# Patient Record
Sex: Female | Born: 1955 | Race: Black or African American | Hispanic: No | Marital: Married | Smoking: Never smoker
Health system: Southern US, Community
[De-identification: ages and names within clinical notes are randomized; demographics above are authoritative.]

## PROBLEM LIST (undated history)

## (undated) DIAGNOSIS — L80 Vitiligo: Secondary | ICD-10-CM

## (undated) DIAGNOSIS — R42 Dizziness and giddiness: Secondary | ICD-10-CM

## (undated) DIAGNOSIS — L910 Hypertrophic scar: Secondary | ICD-10-CM

## (undated) HISTORY — DX: Vitiligo: L80

## (undated) HISTORY — DX: Hypertrophic scar: L91.0

## (undated) HISTORY — PX: ROTATOR CUFF REPAIR: SHX139

## (undated) HISTORY — DX: Dizziness and giddiness: R42

---

## 1999-03-07 ENCOUNTER — Encounter: Payer: Self-pay | Admitting: Emergency Medicine

## 1999-03-07 ENCOUNTER — Emergency Department (HOSPITAL_COMMUNITY): Admission: EM | Admit: 1999-03-07 | Discharge: 1999-03-07 | Payer: Self-pay | Admitting: Emergency Medicine

## 2010-10-19 ENCOUNTER — Emergency Department (HOSPITAL_COMMUNITY): Admission: EM | Admit: 2010-10-19 | Discharge: 2010-10-19 | Payer: Self-pay | Admitting: Emergency Medicine

## 2010-12-13 DIAGNOSIS — L80 Vitiligo: Secondary | ICD-10-CM

## 2010-12-13 HISTORY — DX: Vitiligo: L80

## 2012-01-31 ENCOUNTER — Encounter: Payer: Self-pay | Admitting: *Deleted

## 2012-02-01 ENCOUNTER — Encounter: Payer: Self-pay | Admitting: Family Medicine

## 2012-02-01 ENCOUNTER — Ambulatory Visit (INDEPENDENT_AMBULATORY_CARE_PROVIDER_SITE_OTHER): Payer: BC Managed Care – PPO | Admitting: Family Medicine

## 2012-02-01 VITALS — BP 114/67 | HR 74 | Temp 98.8°F | Resp 20 | Ht 65.0 in | Wt 171.8 lb

## 2012-02-01 DIAGNOSIS — R51 Headache: Secondary | ICD-10-CM

## 2012-02-01 DIAGNOSIS — R42 Dizziness and giddiness: Secondary | ICD-10-CM

## 2012-02-01 DIAGNOSIS — Z13220 Encounter for screening for lipoid disorders: Secondary | ICD-10-CM

## 2012-02-01 NOTE — Progress Notes (Signed)
  Subjective:    Patient ID: Leah Barrera, female    DOB: 1955-12-18, 56 y.o.   MRN: [id number]  HPI  This 56 y.o. AA female c/o vague headache with dizziness for several weeks and describes  a sensation which she says is a brief "sizzling" on the posterior right scalp. No other associated symptoms  Last eye exam was 1 year ago. Does have occasional spots flashing before eyes. Notices vertigo-type sensation  if she turns her head too fast. She denies any head or neck trauma. The headache does not interfere  with sleep, appetite or other activities. She does note that she still has some symptoms of perimenopause.    Review of Systems  Constitutional: Negative.   Eyes: Positive for visual disturbance. Negative for photophobia.  Respiratory: Negative.   Cardiovascular: Negative.   Gastrointestinal: Negative.   Genitourinary: Negative.   Musculoskeletal: Negative.   Neurological: Positive for dizziness and headaches. Negative for tremors, seizures, speech difficulty and numbness.  Hematological: Negative.   Psychiatric/Behavioral: Negative.        Objective:   Physical Exam  Nursing note and vitals reviewed. Constitutional: She is oriented to person, place, and time. She appears well-developed and well-nourished. No distress.  HENT:  Head: Normocephalic and atraumatic.  Right Ear: External ear normal.  Left Ear: External ear normal.  Nose: Nose normal.  Mouth/Throat: Oropharynx is clear and moist.  Eyes: Conjunctivae and EOM are normal. Pupils are equal, round, and reactive to light. No scleral icterus.  Neck: Normal range of motion. Neck supple. No thyromegaly present.  Cardiovascular: Normal rate, regular rhythm and normal heart sounds.   No murmur heard. Pulmonary/Chest: Effort normal and breath sounds normal. No respiratory distress. She has no wheezes.  Musculoskeletal: Normal range of motion. She exhibits no edema.  Lymphadenopathy:    She has no cervical adenopathy.    Neurological: She is alert and oriented to person, place, and time. She has normal reflexes. No cranial nerve deficit. Coordination normal.  Skin: Skin is warm and dry.       Facial area: pigmentation variable with large hypopigmented areas  Psychiatric: She has a normal mood and affect. Her behavior is normal.          Assessment & Plan:   1. Headache disorder  CBC, Comprehensive metabolic panel, TSH, Vitamin D, 25-hydroxy  2. Lipid screening  Lipid panel   Labs to be drawn in the AM; phlebotomist was unable to obtain specimens so pt will hydrate tonight and RTC in AM.

## 2012-02-01 NOTE — Patient Instructions (Signed)
Headache, General, Unknown Cause The specific cause of your headache may not have been found today. There are many causes and types of headache. A few common ones are:  Tension headache.   Migraine.   Infections (examples: dental and sinus infections).   Bone and/or joint problems in the neck or jaw.   Depression.   Eye problems.  These headaches are not life threatening.  Headaches can sometimes be diagnosed by a patient history and a physical exam. Sometimes, lab and imaging studies (such as x-ray and/or CT scan) are used to rule out more serious problems. In some cases, a spinal tap (lumbar puncture) may be requested. There are many times when your exam and tests may be normal on the first visit even when there is a serious problem causing your headaches. Because of that, it is very important to follow up with your doctor or local clinic for further evaluation. FINDING OUT THE RESULTS OF TESTS  If a radiology test was performed, a radiologist will review your results.   You will be contacted by the emergency department or your physician if any test results require a change in your treatment plan.   Not all test results may be available during your visit. If your test results are not back during the visit, make an appointment with your caregiver to find out the results. Do not assume everything is normal if you have not heard from your caregiver or the medical facility. It is important for you to follow up on all of your test results.  HOME CARE INSTRUCTIONS   Keep follow-up appointments with your caregiver, or any specialist referral.   Only take over-the-counter or prescription medicines for pain, discomfort, or fever as directed by your caregiver.   Biofeedback, massage, or other relaxation techniques may be helpful.   Ice packs or heat applied to the head and neck can be used. Do this three to four times per day, or as needed.   Call your doctor if you have any questions or  concerns.   If you smoke, you should quit.  SEEK MEDICAL CARE IF:   You develop problems with medications prescribed.   You do not respond to or obtain relief from medications.   You have a change from the usual headache.   You develop nausea or vomiting.  SEEK IMMEDIATE MEDICAL CARE IF:   If your headache becomes severe.   You have an unexplained oral temperature above 102 F (38.9 C), or as your caregiver suggests.   You have a stiff neck.   You have loss of vision.   You have muscular weakness.   You have loss of muscular control.   You develop severe symptoms different from your first symptoms.   You start losing your balance or have trouble walking.   You feel faint or pass out.  MAKE SURE YOU:   Understand these instructions.   Will watch your condition.   Will get help right away if you are not doing well or get worse.  Document Released: 11/29/2005 Document Revised: 08/11/2011 Document Reviewed: 07/18/2008 [facility] Patient Information 2012 [place], [place]    .Vertigo Vertigo means you feel like you or your surroundings are moving when they are not. Vertigo can be dangerous if it occurs when you are at work, driving, or performing difficult activities.  CAUSES  Vertigo occurs when there is a conflict of signals sent to your brain from the visual and sensory systems in your body. There are many different  causes of vertigo, including:  Infections, especially in the inner ear.   A bad reaction to a drug or misuse of alcohol and medicines.   Withdrawal from drugs or alcohol.   Rapidly changing positions, such as lying down or rolling over in bed.   A migraine headache.   Decreased blood flow to the brain.   Increased pressure in the brain from a head injury, infection, tumor, or bleeding.  SYMPTOMS  You may feel as though the world is spinning around or you are falling to the ground. Because your balance is upset, vertigo can cause nausea and  vomiting. You may have involuntary eye movements (nystagmus). DIAGNOSIS  Vertigo is usually diagnosed by physical exam. If the cause of your vertigo is unknown, your caregiver may perform imaging tests, such as an MRI scan (magnetic resonance imaging). TREATMENT  Most cases of vertigo resolve on their own, without treatment. Depending on the cause, your caregiver may prescribe certain medicines. If your vertigo is related to body position issues, your caregiver may recommend movements or procedures to correct the problem. In rare cases, if your vertigo is caused by certain inner ear problems, you may need surgery. HOME CARE INSTRUCTIONS   Follow your caregiver's instructions.   Avoid driving.   Avoid operating heavy machinery.   Avoid performing any tasks that would be dangerous to you or others during a vertigo episode.   Tell your caregiver if you notice that certain medicines seem to be causing your vertigo. Some of the medicines used to treat vertigo episodes can actually make them worse in some people.  SEEK IMMEDIATE MEDICAL CARE IF:   Your medicines do not relieve your vertigo or are making it worse.   You develop problems with talking, walking, weakness, or using your arms, hands, or legs.   You develop severe headaches.   Your nausea or vomiting continues or gets worse.   You develop visual changes.   A family member notices behavioral changes.   Your condition gets worse.  MAKE SURE YOU:  Understand these instructions.   Will watch your condition.   Will get help right away if you are not doing well or get worse.  Document Released: 09/08/2005 Document Revised: 08/11/2011 Document Reviewed: 06/17/2011 [facility] Patient Information 2012 [place], [place].

## 2012-02-02 ENCOUNTER — Other Ambulatory Visit: Payer: Self-pay | Admitting: *Deleted

## 2012-02-02 ENCOUNTER — Other Ambulatory Visit: Payer: Self-pay | Admitting: Family Medicine

## 2012-02-02 ENCOUNTER — Other Ambulatory Visit (INDEPENDENT_AMBULATORY_CARE_PROVIDER_SITE_OTHER): Payer: BC Managed Care – PPO

## 2012-02-02 DIAGNOSIS — Z13220 Encounter for screening for lipoid disorders: Secondary | ICD-10-CM

## 2012-02-02 DIAGNOSIS — R51 Headache: Secondary | ICD-10-CM

## 2012-02-03 LAB — LIPID PANEL
Cholesterol: 191 mg/dL (ref 0–200)
Total CHOL/HDL Ratio: 3.5 Ratio
Triglycerides: 70 mg/dL (ref <150)
VLDL: 14 mg/dL (ref 0–40)

## 2012-02-03 LAB — CBC
HCT: 41.3 % (ref 36.0–46.0)
Hemoglobin: 13.7 g/dL (ref 12.0–15.0)
MCH: 28 pg (ref 26.0–34.0)
MCV: 84.3 fL (ref 78.0–100.0)
RBC: 4.9 MIL/uL (ref 3.87–5.11)

## 2012-02-03 LAB — VITAMIN D 25 HYDROXY (VIT D DEFICIENCY, FRACTURES): Vit D, 25-Hydroxy: 21 ng/mL — ABNORMAL LOW (ref 30–89)

## 2012-02-03 LAB — COMPREHENSIVE METABOLIC PANEL
CO2: 22 mEq/L (ref 19–32)
Creat: 0.76 mg/dL (ref 0.50–1.10)
Glucose, Bld: 76 mg/dL (ref 70–99)
Total Bilirubin: 0.7 mg/dL (ref 0.3–1.2)
Total Protein: 7.1 g/dL (ref 6.0–8.3)

## 2012-02-04 NOTE — Progress Notes (Signed)
Quick Note:  Please call pt and advise that the following labs are abnormal... Vitamin D level is low. She needs to get an OTC supplement- 2000 IU per capsule or tablet and take 1 every day  LDL ("bad") cholesterol is a little high but improving diet and healthier eating and exercise will help with that. (the below normal WBC is OK)  Please provide a copy of labs to pt.   Thank you.   ______

## 2012-12-13 DIAGNOSIS — R42 Dizziness and giddiness: Secondary | ICD-10-CM

## 2012-12-13 HISTORY — DX: Dizziness and giddiness: R42

## 2013-02-01 ENCOUNTER — Ambulatory Visit (INDEPENDENT_AMBULATORY_CARE_PROVIDER_SITE_OTHER): Payer: BC Managed Care – PPO | Admitting: Family Medicine

## 2013-02-01 VITALS — BP 132/76 | HR 83 | Temp 97.9°F | Resp 16 | Ht 65.0 in | Wt 174.8 lb

## 2013-02-01 DIAGNOSIS — R42 Dizziness and giddiness: Secondary | ICD-10-CM

## 2013-02-01 LAB — COMPREHENSIVE METABOLIC PANEL
Alkaline Phosphatase: 57 U/L (ref 39–117)
BUN: 13 mg/dL (ref 6–23)
Glucose, Bld: 91 mg/dL (ref 70–99)
Total Bilirubin: 0.6 mg/dL (ref 0.3–1.2)

## 2013-02-01 LAB — POCT CBC
Granulocyte percent: 68 %G (ref 37–80)
MCH, POC: 27.9 pg (ref 27–31.2)
MCV: 86.7 fL (ref 80–97)
MID (cbc): 0.4 (ref 0–0.9)
POC LYMPH PERCENT: 25.4 %L (ref 10–50)
POC MID %: 6.6 %M (ref 0–12)
Platelet Count, POC: 229 10*3/uL (ref 142–424)
RDW, POC: 15.4 %
WBC: 5.5 10*3/uL (ref 4.6–10.2)

## 2013-02-01 MED ORDER — MECLIZINE HCL 25 MG PO TABS: 25.0000 mg | ORAL_TABLET | Freq: Three times a day (TID) | ORAL | Status: DC | PRN

## 2013-02-01 NOTE — Progress Notes (Signed)
[facility] [address], [place] [address] [phone]  Date:  02/01/2013   Name:  Leah Barrera   DOB:  [birth year]   MRN:  [id number]  PCP:  No primary provider on file.    Chief Complaint: Dizziness and Nausea   History of Present Illness:  Leah Barrera is a 57 y.o. very pleasant female patient who presents with the following:  Last seen at our office about one year ago for headache and dizziness, and what sounds like vertigo.   She is here with dizziness and nausea today which has been present for the last 5 or 6 days.   She had a similar problem over the summer.  She did have nausea and vomiting over the weekend- but none for the last 4 days.  Her symptoms are getting better   She has been using dramamine which helps some.  She is no longer vomiting.  She did have a little diarrhea as well  She notes that her spinning sensation can last "all day."  Changing position would make it worse.  Worse with change in head position. No tinnitus, no change in her hearing.   No trauma, no falls.  She is not currently having a HA  She does not have a regular doctor.   No other symptoms of illness such as ST, fever.  No abdominal pain.   She states she has had symtoms like this 4 or 5 times over her life She has still been eating.  She did not eat yet this morinng.  She was not able to eat over the weekend but she is now able to again  Patient Active Problem List  Diagnosis  . Headache disorder    Past Medical History  Diagnosis Date  . Keloid scar     Past Surgical History  Procedure Laterality Date  . Rotator cuff repair      Left    History  Substance Use Topics  . Smoking status: Never Smoker   . Smokeless tobacco: Not on file  . Alcohol Use: No    Family History  Problem Relation Age of Onset  . Heart disease Mother   . Stroke Father     Allergies  Allergen Reactions  . Penicillins Swelling and Rash    Medication list has  been reviewed and updated.  No current outpatient prescriptions on file prior to visit.   No current facility-administered medications on file prior to visit.    Review of Systems:  As per HPI- otherwise negative.   Physical Examination: Filed Vitals:   02/01/13 0859  BP: 132/76  Pulse: 83  Temp: 97.9 F (36.6 C)  Resp: 16   Filed Vitals:   02/01/13 0859  Height: 5\' 5"  (1.651 m)  Weight: 174 lb 12.8 oz (79.289 kg)   Body mass index is 29.09 kg/(m^2). Ideal Body Weight: Weight in (lb) to have BMI = 25: 149.9  GEN: WDWN, NAD, Non-toxic, A & O x 3, overweight, looks well and well groomed/ wearing make up HEENT: Atraumatic, Normocephalic. Neck supple. No masses, No LAD.  Bilateral TM wnl, oropharynx normal.  PEERL,EOMI.   Ears and Nose: No external deformity. CV: RRR, No M/G/R. No JVD. No thrill. No extra heart sounds. PULM: CTA B, no wheezes, crackles, rhonchi. No retractions. No resp. distress. No accessory muscle use. ABD: S, NT, ND. No rebound. No HSM. EXTR: No c/c/e NEURO Normal gait.  Normal strength, sensation and DTR all  extremities.  Positive Dix- halpike on the right, borderline positive on the left PSYCH: Normally interactive. Conversant. Not depressed or anxious appearing.  Calm demeanor.   Results for orders placed in visit on 02/01/13  POCT CBC      Result Value Range   WBC 5.5  4.6 - 10.2 K/uL   Lymph, poc 1.4  0.6 - 3.4   POC LYMPH PERCENT 25.4  10 - 50 %L   MID (cbc) 0.4  0 - 0.9   POC MID % 6.6  0 - 12 %M   POC Granulocyte 3.7  2 - 6.9   Granulocyte percent 68.0  37 - 80 %G   RBC 4.94  4.04 - 5.48 M/uL   Hemoglobin 13.8  12.2 - 16.2 g/dL   HCT, POC 16.1  09.6 - 47.9 %   MCV 86.7  80 - 97 fL   MCH, POC 27.9  27 - 31.2 pg   MCHC 32.2  31.8 - 35.4 g/dL   RDW, POC 04.5     Platelet Count, POC 229  142 - 424 K/uL   MPV 10.1  0 - 99.8 fL  GLUCOSE, POCT (MANUAL RESULT ENTRY)      Result Value Range   POC Glucose 78  70 - 99 mg/dl    Assessment and  Plan: Dizziness and giddiness - Plan: POCT CBC, POCT glucose (manual entry), Comprehensive metabolic panel, TSH, meclizine (ANTIVERT) 25 MG tablet  Leah Barrera is here today with recurrent vertigo.  Her symptoms are getting better, but she would like to try a different medication so will try meclizine. Last took dramamine this am- advised her to allow at least 8 hours prior to taking the meclizine.  Discussed the often benign course of vertigo, but also possibility of other dangerous etiologies. At this time her neuro exam is normal and she looks well.  Did offer to arrange an MRI other imaging today.  However, she would prefer to give her symptoms a little bit more time to resolve and will follow- up if not better.  Also will follow- up with her when I receive the rest of her labs  Jan Olano, MD

## 2013-02-01 NOTE — Patient Instructions (Addendum)
Use the meclizine as needed for dizziness- let me know if you are not continuing to get better over the next few days- Sooner if worse.   I will be in touch with the rest of your labs as soon as they come in

## 2013-02-02 ENCOUNTER — Encounter: Payer: Self-pay | Admitting: Family Medicine

## 2016-10-11 ENCOUNTER — Ambulatory Visit (INDEPENDENT_AMBULATORY_CARE_PROVIDER_SITE_OTHER): Payer: BC Managed Care – PPO | Admitting: Physician Assistant

## 2016-10-11 VITALS — BP 110/76 | HR 69 | Temp 98.2°F | Resp 16 | Ht 65.0 in | Wt 170.0 lb

## 2016-10-11 DIAGNOSIS — L03012 Cellulitis of left finger: Secondary | ICD-10-CM | POA: Diagnosis not present

## 2016-10-11 MED ORDER — DOXYCYCLINE HYCLATE 100 MG PO CAPS: 100.0000 mg | ORAL_CAPSULE | Freq: Two times a day (BID) | ORAL | 0 refills | Status: AC

## 2016-10-11 NOTE — Progress Notes (Signed)
   Subjective:    Patient ID: Leah Barrera, female    DOB: 06/30/1956, 60 y.o.   MRN: [id number]  HPI: Presents with swollen left index finger which she first noticed 5 days ago. States she had an ingrown fingernail on this finger early last week. Associated symptoms include warmth, throbbing, and swelling noted around the finger nail. Denies drainage from the site. States she tried using an ice pack and alcohol on the site at home without much relief. Denies fevers/chills or nausea/vomiting at home.   Declines flu vaccine at this time.  Review of Systems  Constitutional: Negative for chills and fever.  HENT: Negative for congestion, ear discharge, postnasal drip, rhinorrhea, sinus pressure and sore throat.   Eyes: Negative for pain, discharge and itching.  Respiratory: Negative for cough, chest tightness and shortness of breath.   Cardiovascular: Negative for chest pain and palpitations.  Gastrointestinal: Negative for blood in stool, constipation, diarrhea, nausea and vomiting.  Genitourinary: Negative for difficulty urinating, dysuria, frequency, hematuria and urgency.  Skin: Negative for rash.  Neurological: Positive for headaches. Negative for dizziness.  Psychiatric/Behavioral: Negative for dysphoric mood.   Allergies  Allergen Reactions  . Penicillins Swelling and Rash   No current home medications  Patient Active Problem List   Diagnosis Date Noted  . Headache disorder 02/01/2012        Objective:   Physical Exam  Constitutional: She is oriented to person, place, and time. She appears well-developed and well-nourished.  HENT:  Head: Normocephalic and atraumatic.  Eyes: Conjunctivae are normal. Pupils are equal, round, and reactive to light.  Musculoskeletal:       Right hand: She exhibits normal range of motion, no tenderness, normal capillary refill and no swelling. Normal sensation noted. Normal strength noted.       Left hand: She exhibits decreased range of  motion, tenderness and swelling. She exhibits no bony tenderness, normal capillary refill and no deformity. Normal sensation noted. Normal strength noted.       Hands: Neurological: She is alert and oriented to person, place, and time.  Psychiatric: She has a normal mood and affect. Her behavior is normal.   Procedure: Paronychia I&D Verbal consent obtained from patient. Cleaned site with alcohol. #11 blade used to incise and drain, purulent drainage from site. Wound culture of drainage obtained. Finger cleansed and dressed appropriately.     Assessment & Plan:  1. Paronychia of left index finger I&D in clinic with purulent drainage from site. Finger cleansed and dressed. Recommended warm compresses on site and change bandage every 48-72 hours. Rx Doxycycline 100 mg BID x 10 days with food. Instructed to RTC if swelling increases, systemic symptoms, or site is not improving with medication. Wound culture pending, will change as needed upon review of results.  - WOUND CULTURE - doxycycline (VIBRAMYCIN) 100 MG capsule; Take 1 capsule (100 mg total) by mouth 2 (two) times daily.  Dispense: 20 capsule; Refill: 0

## 2016-10-11 NOTE — Patient Instructions (Addendum)
     IF you received an x-ray today, you will receive an invoice from [facility]. Please contact [facility] at [phone] with questions or concerns regarding your invoice.   IF you received labwork today, you will receive an invoice from [facility]. Please contact [facility] at [phone] with questions or concerns regarding your invoice.   Our billing staff will not be able to assist you with questions regarding bills from these companies.  You will be contacted with the lab results as soon as they are available. The fastest way to get your results is to activate your My Chart account. Instructions are located on the last page of this paperwork. If you have not heard from us regarding the results in 2 weeks, please contact this office.     Fingertip Infection When an infection is around the nail, it is called a paronychia. When it appears over the tip of the finger, it is called a felon. These infections are due to minor injuries or cracks in the skin. If they are not treated properly, they can lead to bone infection and permanent damage to the fingernail. Incision and drainage is necessary if a pus pocket (an abscess) has formed. Antibiotics and pain medicine may also be needed. Keep your hand elevated for the next 2-3 days to reduce swelling and pain. If a pack was placed in the abscess, it should be removed in 1-2 days by your caregiver. Soak the finger in warm water for 20 minutes 4 times daily to help promote drainage. Keep the hands as dry as possible. Wear protective gloves with cotton liners. See your caregiver for follow-up care as recommended.  HOME CARE INSTRUCTIONS   Keep wound clean, dry and dressed as suggested by your caregiver.  Soak in warm salt water for fifteen minutes, four times per day for bacterial infections.  Your caregiver will prescribe an antibiotic if a bacterial infection is suspected. Take antibiotics as directed  and finish the prescription, even if the problem appears to be improving before the medicine is gone.  Only take over-the-counter or prescription medicines for pain, discomfort, or fever as directed by your caregiver. SEEK IMMEDIATE MEDICAL CARE IF:  There is redness, swelling, or increasing pain in the wound.  Pus or any other unusual drainage is coming from the wound.  An unexplained oral temperature above 102 F (38.9 C) develops.  You notice a foul smell coming from the wound or dressing. MAKE SURE YOU:   Understand these instructions.  Monitor your condition.  Contact your caregiver if you are getting worse or not improving.   This information is not intended to replace advice given to you by your health care provider. Make sure you discuss any questions you have with your health care provider.   Document Released: 01/06/2005 Document Revised: 02/21/2012 Document Reviewed: 05/19/2015 Elsevier Interactive Patient Education [employer]2016 Elsevier Inc.

## 2016-10-11 NOTE — Progress Notes (Signed)
   Patient ID: Leah Barrera, female     DOB: 02/24/1956, 60 y.o.    MRN: [id number]  PCP: No PCP Per Patient  Chief Complaint  Patient presents with  . Finger swollen    left index , x 2 days    Subjective:    HPI  Presents for evaluation of 5 days of pain, redness and swelling of the LEFT index fingers.  She reports having had a hangnail just prior to the onset of her symptoms.  Ice application and alcohol rub have been ineffective.   Prior to Admission medications   Not on File     Allergies  Allergen Reactions  . Penicillins Swelling and Rash     Patient Active Problem List   Diagnosis Date Noted  . Headache disorder 02/01/2012     Family History  Problem Relation Age of Onset  . Heart disease Mother   . Stroke Father      Social History   Social History  . Marital status: Married    Spouse name: N/A  . Number of children: N/A  . Years of education: 17+   Occupational History  . Geologist, engineeringteacher assistant    Social History Main Topics  . Smoking status: Never Smoker  . Smokeless tobacco: Never Used  . Alcohol use No  . Drug use: No  . Sexual activity: Not Currently   Other Topics Concern  . Not on file   Social History Narrative  . No narrative on file        Review of Systems Constitutional: Negative for chills and fever.  HENT: Negative for congestion, ear discharge, postnasal drip, rhinorrhea, sinus pressure and sore throat.   Eyes: Negative for pain, discharge and itching.  Respiratory: Negative for cough, chest tightness and shortness of breath.   Cardiovascular: Negative for chest pain and palpitations.  Gastrointestinal: Negative for blood in stool, constipation, diarrhea, nausea and vomiting.  Genitourinary: Negative for difficulty urinating, dysuria, frequency, hematuria and urgency.  Skin: Negative for rash.  Neurological: Positive for headaches. Negative for dizziness.  Psychiatric/Behavioral: Negative for dysphoric mood.        Objective:  Physical Exam  Constitutional: She is oriented to person, place, and time. She appears well-developed and well-nourished. She is active and cooperative. No distress.  BP 110/76 (BP Location: Left Arm, Patient Position: Sitting, Cuff Size: Normal)   Pulse 69   Temp 98.2 F (36.8 C) (Oral)   Resp 16   Ht 5\' 5"  (1.651 m)   Wt 170 lb (77.1 kg)   SpO2 99%   BMI 28.29 kg/m    Eyes: Conjunctivae are normal.  Pulmonary/Chest: Effort normal.  Musculoskeletal:       Hands: Neurological: She is alert and oriented to person, place, and time.  Skin: Skin is warm and dry.  Psychiatric: She has a normal mood and affect. Her speech is normal and behavior is normal.    PROCEDURE: Alcohol pad prep. 1 blade used to incise the abscess with drainage of greenish purulence. Cleansed. Bandaid applied.         Assessment & Plan:  1. Paronychia of left index finger Local wound care. Warm compresses. NSAIDS. Anticipatory guidance provided. RTC if symptoms worsen/persist. - WOUND CULTURE - doxycycline (VIBRAMYCIN) 100 MG capsule; Take 1 capsule (100 mg total) by mouth 2 (two) times daily.  Dispense: 20 capsule; Refill: 0   Fernande Brashelle S. Kamaron Deskins, PA-C Physician Assistant-Certified [facility]

## 2016-10-14 LAB — WOUND CULTURE: Gram Stain: NONE SEEN

## 2017-07-21 ENCOUNTER — Ambulatory Visit (INDEPENDENT_AMBULATORY_CARE_PROVIDER_SITE_OTHER): Payer: BC Managed Care – PPO

## 2017-07-21 ENCOUNTER — Ambulatory Visit (INDEPENDENT_AMBULATORY_CARE_PROVIDER_SITE_OTHER): Payer: BC Managed Care – PPO | Admitting: Physician Assistant

## 2017-07-21 ENCOUNTER — Encounter: Payer: Self-pay | Admitting: Physician Assistant

## 2017-07-21 VITALS — BP 129/69 | HR 64 | Temp 98.3°F | Resp 16 | Ht 65.0 in | Wt 174.0 lb

## 2017-07-21 DIAGNOSIS — H538 Other visual disturbances: Secondary | ICD-10-CM

## 2017-07-21 DIAGNOSIS — M25511 Pain in right shoulder: Secondary | ICD-10-CM | POA: Diagnosis not present

## 2017-07-21 DIAGNOSIS — R238 Other skin changes: Secondary | ICD-10-CM | POA: Diagnosis not present

## 2017-07-21 DIAGNOSIS — R233 Spontaneous ecchymoses: Secondary | ICD-10-CM

## 2017-07-21 LAB — GLUCOSE, POCT (MANUAL RESULT ENTRY): POC GLUCOSE: 85 mg/dL (ref 70–99)

## 2017-07-21 LAB — POCT CBC
GRANULOCYTE PERCENT: 50.9 % (ref 37–80)
HEMATOCRIT: 37.8 % (ref 37.7–47.9)
Hemoglobin: 12.8 g/dL (ref 12.2–16.2)
Lymph, poc: 1 (ref 0.6–3.4)
MCH: 26.8 pg — AB (ref 27–31.2)
MCHC: 33.8 g/dL (ref 31.8–35.4)
MCV: 79.2 fL — AB (ref 80–97)
MID (CBC): 0.4 (ref 0–0.9)
MPV: 7.9 fL (ref 0–99.8)
PLATELET COUNT, POC: 159 10*3/uL (ref 142–424)
POC GRANULOCYTE: 1.5 — AB (ref 2–6.9)
POC LYMPH PERCENT: 36 %L (ref 10–50)
POC MID %: 13.1 % — AB (ref 0–12)
RBC: 4.77 M/uL (ref 4.04–5.48)
RDW, POC: 15.6 %
WBC: 2.9 10*3/uL — AB (ref 4.6–10.2)

## 2017-07-21 LAB — CMP14+EGFR
A/G RATIO: 1 — AB (ref 1.2–2.2)
ALK PHOS: 57 IU/L (ref 39–117)
ALT: 15 IU/L (ref 0–32)
AST: 20 IU/L (ref 0–40)
Albumin: 3.8 g/dL (ref 3.6–4.8)
BILIRUBIN TOTAL: 0.5 mg/dL (ref 0.0–1.2)
BUN / CREAT RATIO: 19 (ref 12–28)
BUN: 14 mg/dL (ref 8–27)
CO2: 20 mmol/L (ref 20–29)
Calcium: 8.9 mg/dL (ref 8.7–10.3)
Chloride: 106 mmol/L (ref 96–106)
Creatinine, Ser: 0.74 mg/dL (ref 0.57–1.00)
GFR calc Af Amer: 100 mL/min/{1.73_m2} (ref >59)
GFR calc non Af Amer: 86 mL/min/{1.73_m2} (ref >59)
GLOBULIN, TOTAL: 3.7 g/dL (ref 1.5–4.5)
Glucose: 79 mg/dL (ref 65–99)
POTASSIUM: 4.2 mmol/L (ref 3.5–5.2)
SODIUM: 141 mmol/L (ref 134–144)
Total Protein: 7.5 g/dL (ref 6.0–8.5)

## 2017-07-21 NOTE — Progress Notes (Signed)
PRIMARY CARE AT [facility] [address], [place] [address] [phone]  Date:  07/21/2017   Name:  Leah Barrera   DOB:  September 07, 1956   MRN:  [id number]  PCP:  Patient, No Pcp Per    History of Present Illness:  Leah Barrera is a 61 y.o. female patient who presents to PCP with  Chief Complaint  Patient presents with  . Shoulder Pain    right/ x 3 wks  . Eye Problem    pt states she sees spots/ x 3 wks     She has been seeing spots for the last 3 weeks, which may blur her vision.  No headaches or the common headache symptoms that she may encounter.  No nausea, dizziness, or chest pain associated with the vision change.  Not associated with eating meals.  It is progressively worsening in the blurriness as well as the episodes.  Episode will last seconds.   Shoulder pain at the right side with bruising that started 6 months without trauma without pain, but now worsened.  She placed topical anesthesia otc creams but this was after the bruising occurred.  Shoulder pain that extending down arm.   She was last seen by a opthalmologist over 1 year ago.    Patient Active Problem List   Diagnosis Date Noted  . Headache disorder 02/01/2012    Past Medical History:  Diagnosis Date  . Keloid scar   . Vertigo 2014  . Vitiligo 2012    Past Surgical History:  Procedure Laterality Date  . ROTATOR CUFF REPAIR     Left    Social History  Substance Use Topics  . Smoking status: Never Smoker  . Smokeless tobacco: Never Used  . Alcohol use No    Family History  Problem Relation Age of Onset  . Heart disease Mother   . Stroke Father     Allergies  Allergen Reactions  . Penicillins Swelling and Rash    Medication list has been reviewed and updated.  No current outpatient prescriptions on file prior to visit.   No current facility-administered medications on file prior to visit.     ROS ROS otherwise unremarkable unless listed above.  Physical Examination: BP 129/69    Pulse 64   Temp 98.3 F (36.8 C) (Oral)   Resp 16   Ht '5\' 5"'$  (1.651 m)   Wt 174 lb (78.9 kg)   SpO2 99%   BMI 28.96 kg/m  Ideal Body Weight: Weight in (lb) to have BMI = 25: 149.9  Physical Exam  Constitutional: She is oriented to person, place, and time. She appears well-developed and well-nourished. No distress.  HENT:  Head: Normocephalic and atraumatic.  Right Ear: External ear normal.  Left Ear: External ear normal.  Eyes: Pupils are equal, round, and reactive to light. Conjunctivae and EOM are normal.  No abnormalities detected, however mildly difficut.  Cardiovascular: Normal rate.   Pulmonary/Chest: Effort normal. No respiratory distress.  Musculoskeletal:  Slight hyperpigmentation at the right shoulder and right anterior elbow.  No tenderness. rom limited with external rotation.     Neurological: She is alert and oriented to person, place, and time.  Skin: She is not diaphoretic.  Psychiatric: She has a normal mood and affect. Her behavior is normal.    Results for orders placed or performed in visit on 07/21/17  POCT glucose (manual entry)  Result Value Ref Range   POC Glucose 85 70 - 99 mg/dl  POCT CBC  Result Value Ref Range   WBC 2.9 (A) 4.6 - 10.2 K/uL   Lymph, poc 1.0 0.6 - 3.4   POC LYMPH PERCENT 36.0 10 - 50 %L   MID (cbc) 0.4 0 - 0.9   POC MID % 13.1 (A) 0 - 12 %M   POC Granulocyte 1.5 (A) 2 - 6.9   Granulocyte percent 50.9 37 - 80 %G   RBC 4.77 4.04 - 5.48 M/uL   Hemoglobin 12.8 12.2 - 16.2 g/dL   HCT, POC 37.8 37.7 - 47.9 %   MCV 79.2 (A) 80 - 97 fL   MCH, POC 26.8 (A) 27 - 31.2 pg   MCHC 33.8 31.8 - 35.4 g/dL   RDW, POC 15.6 %   Platelet Count, POC 159 142 - 424 K/uL   MPV 7.9 0 - 99.8 fL   Dg Shoulder Right  Result Date: 07/21/2017 CLINICAL DATA:  Right shoulder pain.  No injury. EXAM: RIGHT SHOULDER - 2+ VIEW COMPARISON:  None. FINDINGS: There is no evidence of fracture or dislocation. Mild degenerative changes of the acromioclavicular  joint. Soft tissues are unremarkable. IMPRESSION: 1.  No acute osseous abnormality. 2. Mild acromioclavicular degenerative changes. Electronically Signed   By: Titus Dubin M.D.   On: 07/21/2017 11:52    Assessment and Plan: Leah Barrera is a 61 y.o. female who is here today for cc of right arm pain and change in skin pigmentation Hx of vitiligo.   Advised start of baby aspirin and tylenol for the pain.  Will follow up with results.   Opthalmology consult appreciated at this time.   Pain in joint of right shoulder - Plan: POCT glucose (manual entry), POCT CBC, Pathologist smear review, CMP14+EGFR  Acute pain of right shoulder - Plan: DG Shoulder Right, Pathologist smear review, CMP14+EGFR  Visual blurriness - Plan: POCT glucose (manual entry), POCT CBC, Pathologist smear review, CMP14+EGFR, Ambulatory referral to Ophthalmology  Abnormal bruising - Plan: POCT glucose (manual entry), POCT CBC, Pathologist smear review, CMP14+EGFR, Ambulatory referral to Ophthalmology  Ivar Drape, PA-C [facility] 8/12/20188:23 PM

## 2017-07-21 NOTE — Patient Instructions (Addendum)
Please start an aspirin daily at 81 mg daily.   Please await contact for the opthalmology appointment.  If you find out that there are ophthalmologist at [facility] than you may cancel.    IF you received an x-ray today, you will receive an invoice from [facility]. Please contact [facility] at [phone] with questions or concerns regarding your invoice.   IF you received labwork today, you will receive an invoice from [facility]. Please contact [facility] at [phone] with questions or concerns regarding your invoice.   Our billing staff will not be able to assist you with questions regarding bills from these companies.  You will be contacted with the lab results as soon as they are available. The fastest way to get your results is to activate your My Chart account. Instructions are located on the last page of this paperwork. If you have not heard from us regarding the results in 2 weeks, please contact this office.

## 2017-07-22 ENCOUNTER — Ambulatory Visit: Payer: BC Managed Care – PPO | Admitting: Family Medicine

## 2017-07-29 LAB — PATHOLOGIST SMEAR REVIEW
BASOS ABS: 0 10*3/uL (ref 0.0–0.2)
Basos: 0 %
EOS (ABSOLUTE): 0 10*3/uL (ref 0.0–0.4)
Eos: 0 %
HEMOGLOBIN: 12.5 g/dL (ref 11.1–15.9)
Hematocrit: 38.9 % (ref 34.0–46.6)
IMMATURE GRANULOCYTES: 0 %
Immature Grans (Abs): 0 10*3/uL (ref 0.0–0.1)
LYMPHS ABS: 0.9 10*3/uL (ref 0.7–3.1)
Lymphs: 30 %
MCH: 26.5 pg — AB (ref 26.6–33.0)
MCHC: 32.1 g/dL (ref 31.5–35.7)
MCV: 83 fL (ref 79–97)
MONOCYTES: 18 %
Monocytes Absolute: 0.5 10*3/uL (ref 0.1–0.9)
NEUTROS ABS: 1.5 10*3/uL (ref 1.4–7.0)
NEUTROS PCT: 52 %
PLATELETS: 184 10*3/uL (ref 150–379)
Path Rev PLTs: NORMAL
Path Rev RBC: NORMAL
RBC: 4.71 x10E6/uL (ref 3.77–5.28)
RDW: 15.9 % — AB (ref 12.3–15.4)
WBC: 2.9 10*3/uL — ABNORMAL LOW (ref 3.4–10.8)

## 2017-08-17 ENCOUNTER — Telehealth: Payer: Self-pay | Admitting: Physician Assistant

## 2017-08-17 NOTE — Telephone Encounter (Signed)
Pt is still having shoulder pain and would like a call back as to what to do next   Best number [phone]

## 2017-08-19 NOTE — Telephone Encounter (Signed)
Attempted to contact patient myself.  If she is still having the shoulder pain, she should return.  I would like to recheck the shoulder and her symptoms as a whole.

## 2017-08-22 ENCOUNTER — Encounter: Payer: Self-pay | Admitting: Physician Assistant

## 2017-08-22 ENCOUNTER — Ambulatory Visit (INDEPENDENT_AMBULATORY_CARE_PROVIDER_SITE_OTHER): Payer: BC Managed Care – PPO | Admitting: Physician Assistant

## 2017-08-22 VITALS — BP 113/72 | HR 75 | Temp 98.1°F | Resp 16 | Ht 65.0 in | Wt 178.0 lb

## 2017-08-22 DIAGNOSIS — M19011 Primary osteoarthritis, right shoulder: Secondary | ICD-10-CM | POA: Diagnosis not present

## 2017-08-22 DIAGNOSIS — M25511 Pain in right shoulder: Secondary | ICD-10-CM

## 2017-08-22 MED ORDER — PREDNISONE 20 MG PO TABS: ORAL_TABLET | ORAL | 0 refills | Status: DC

## 2017-08-22 NOTE — Progress Notes (Signed)
[facility] [address], [place] [address] [phone]  Date:  08/22/2017   Name:  Leah Barrera   DOB:  05/22/1956   MRN:  [id number]  PCP:  Patient, No Pcp Per    History of Present Illness:  Leah Barrera is a 61 y.o. female patient who presents to PCP with  Chief Complaint  Patient presents with  . Follow-up    shoulder/upperarm, right. pt states still not feeling better.       Patient Active Problem List   Diagnosis Date Noted  . Headache disorder 02/01/2012    Past Medical History:  Diagnosis Date  . Keloid scar   . Vertigo 2014  . Vitiligo 2012    Past Surgical History:  Procedure Laterality Date  . ROTATOR CUFF REPAIR     Left    Social History  Substance Use Topics  . Smoking status: Never Smoker  . Smokeless tobacco: Never Used  . Alcohol use No    Family History  Problem Relation Age of Onset  . Heart disease Mother   . Stroke Father     Allergies  Allergen Reactions  . Penicillins Swelling and Rash    Medication list has been reviewed and updated.  Current Outpatient Prescriptions on File Prior to Visit  Medication Sig Dispense Refill  . acetaminophen (TYLENOL) 325 MG tablet Take 650 mg by mouth every 6 (six) hours as needed.     No current facility-administered medications on file prior to visit.     ROS ROS otherwise unremarkable unless listed above.  Physical Examination: BP 113/72   Pulse 75   Temp 98.1 F (36.7 C) (Oral)   Resp 16   Ht 5\' 5"  (1.651 m)   Wt 178 lb (80.7 kg)   SpO2 97%   BMI 29.62 kg/m  Ideal Body Weight: Weight in (lb) to have BMI = 25: 149.9  Physical Exam   Assessment and Plan: Leah Barrera is a 61 y.o. female who is here today  There are no diagnoses linked to this encounter.  Trena PlattStephanie English, PA-C [facility] 08/22/2017 9:34 AM

## 2017-08-22 NOTE — Progress Notes (Signed)
[facility] [address], [place] [address] [phone]  Date:  08/22/2017   Name:  Leah Barrera   DOB:  12/04/1956   MRN:  [id number]  PCP:  Patient, No Pcp Per    History of Present Illness:  Leah Barrera is a 61 y.o. female patient who presents to PCP with  Chief Complaint  Patient presents with  . Follow-up    shoulder/upperarm, right. pt states still not feeling better.     Patient follow up for shoulder pain.  Patient states that it continues to be prominent on the right upper arm.  No radiating numbness or tingling.  The darkening along the skin is prominent.  She has been using heat at this time.   The headache has resolved.  She states that this comes and goes, and normal.  But no dizziness or nausea.  She has not noticed darkening of her skin anywhere else.   Patient Active Problem List   Diagnosis Date Noted  . Headache disorder 02/01/2012    Past Medical History:  Diagnosis Date  . Keloid scar   . Vertigo 2014  . Vitiligo 2012    Past Surgical History:  Procedure Laterality Date  . ROTATOR CUFF REPAIR     Left    Social History  Substance Use Topics  . Smoking status: Never Smoker  . Smokeless tobacco: Never Used  . Alcohol use No    Family History  Problem Relation Age of Onset  . Heart disease Mother   . Stroke Father     Allergies  Allergen Reactions  . Penicillins Swelling and Rash    Medication list has been reviewed and updated.  Current Outpatient Prescriptions on File Prior to Visit  Medication Sig Dispense Refill  . acetaminophen (TYLENOL) 325 MG tablet Take 650 mg by mouth every 6 (six) hours as needed.     No current facility-administered medications on file prior to visit.     ROS ROS otherwise unremarkable unless listed above.  Physical Examination: BP 113/72   Pulse 75   Temp 98.1 F (36.7 C) (Oral)   Resp 16   Ht 5\' 5"  (1.651 m)   Wt 178 lb (80.7 kg)   SpO2 97%   BMI 29.62 kg/m  Ideal Body  Weight: Weight in (lb) to have BMI = 25: 149.9  Physical Exam  Constitutional: She is oriented to person, place, and time. She appears well-developed and well-nourished. No distress.  HENT:  Head: Normocephalic and atraumatic.  Right Ear: External ear normal.  Left Ear: External ear normal.  Eyes: Pupils are equal, round, and reactive to light. Conjunctivae and EOM are normal.  Cardiovascular: Normal rate, regular rhythm, normal heart sounds and intact distal pulses.  Exam reveals no friction rub.   No murmur heard. Pulmonary/Chest: Effort normal. No respiratory distress. She has no wheezes.  Neurological: She is alert and oriented to person, place, and time.  Skin: She is not diaphoretic.  Psychiatric: She has a normal mood and affect. Her behavior is normal.    Assessment and Plan: Leah Barrera is a 61 y.o. female who is here today for cc of  Chief Complaint  Patient presents with  . Follow-up    shoulder/upperarm, right. pt states still not feeling better.  --this is likely secondary to degenerative changes, however low wbc is concerning.  I am obtaining sed rate today.  If this is low, we will refer to rheum.  Normal, we  may seek counsel with ortho at this time for her right shoulder pain. --oral prednisone taper.  Acute pain of right shoulder - Plan: Sedimentation Rate  Arthritis of right acromioclavicular joint  Trena Platt, PA-C [facility] 9/11/20188:06 AM

## 2017-08-22 NOTE — Patient Instructions (Addendum)
Please try and ice the area. Work on the shoulder exercises I have listed below. Make sure to take the prednisone as prescribed.  I will follow up with the lab results.     IF you received an x-ray today, you will receive an invoice from [facility]. Please contact [facility] at [phone] with questions or concerns regarding your invoice.   IF you received labwork today, you will receive an invoice from [facility]. Please contact [facility] at [phone] with questions or concerns regarding your invoice.   Our billing staff will not be able to assist you with questions regarding bills from these companies.  You will be contacted with the lab results as soon as they are available. The fastest way to get your results is to activate your My Chart account. Instructions are located on the last page of this paperwork. If you have not heard from us regarding the results in 2 weeks, please contact this office.

## 2017-08-23 LAB — SEDIMENTATION RATE: SED RATE: 35 mm/h (ref 0–40)

## 2017-12-26 ENCOUNTER — Encounter: Payer: Self-pay | Admitting: Physician Assistant

## 2017-12-26 ENCOUNTER — Ambulatory Visit: Payer: BC Managed Care – PPO | Admitting: Physician Assistant

## 2017-12-26 ENCOUNTER — Other Ambulatory Visit: Payer: Self-pay

## 2017-12-26 VITALS — BP 114/60 | HR 78 | Temp 97.7°F | Resp 16 | Ht 65.0 in | Wt 175.0 lb

## 2017-12-26 DIAGNOSIS — R590 Localized enlarged lymph nodes: Secondary | ICD-10-CM | POA: Diagnosis not present

## 2017-12-26 DIAGNOSIS — R221 Localized swelling, mass and lump, neck: Secondary | ICD-10-CM | POA: Diagnosis not present

## 2017-12-26 LAB — POCT RAPID STREP A (OFFICE): Rapid Strep A Screen: NEGATIVE

## 2017-12-26 MED ORDER — CLINDAMYCIN HCL 300 MG PO CAPS: 300.0000 mg | ORAL_CAPSULE | Freq: Three times a day (TID) | ORAL | 0 refills | Status: AC

## 2017-12-26 NOTE — Progress Notes (Signed)
PRIMARY CARE AT [facility] [address], [place] [address] [phone]  Date:  12/26/2017   Name:  Leah Barrera   DOB:  28-Jan-1956   MRN:  [id number]  PCP:  Patient, No Pcp Per    History of Present Illness:  Leah Barrera is a 62 y.o. female patient who presents to PCP with  Chief Complaint  Patient presents with  . Lymphadenopathy    left sidex 2 wks/ pt has been icing the area     2 weeks of left sided neck pain and swelling. Some pain with swallowing No abnormal fatigue, or malaise No fever She notes no difficulty with sob or dyspnea.     Patient Active Problem List   Diagnosis Date Noted  . Headache disorder 02/01/2012    Past Medical History:  Diagnosis Date  . Keloid scar   . Vertigo 2014  . Vitiligo 2012    Past Surgical History:  Procedure Laterality Date  . ROTATOR CUFF REPAIR     Left    Social History   Tobacco Use  . Smoking status: Never Smoker  . Smokeless tobacco: Never Used  Substance Use Topics  . Alcohol use: No  . Drug use: No    Family History  Problem Relation Age of Onset  . Heart disease Mother   . Stroke Father     Allergies  Allergen Reactions  . Penicillins Swelling and Rash    Medication list has been reviewed and updated.  Current Outpatient Medications on File Prior to Visit  Medication Sig Dispense Refill  . acetaminophen (TYLENOL) 325 MG tablet Take 650 mg by mouth every 6 (six) hours as needed.    . predniSONE (DELTASONE) 20 MG tablet Take 3 PO QAM x2days, 2 PO QAM x2days, 1 PO QAM x3days (Patient not taking: Reported on 12/26/2017) 13 tablet 0   No current facility-administered medications on file prior to visit.     ROS ROS otherwise unremarkable unless listed above.  Physical Examination: BP 114/60   Pulse 78   Temp 97.7 F (36.5 C) (Oral)   Resp 16   Ht 5\' 5"  (1.651 m)   Wt 175 lb (79.4 kg)   SpO2 98%   BMI 29.12 kg/m  Ideal Body Weight: Weight in (lb) to have BMI = 25: 149.9  Physical  Exam  Constitutional: She is oriented to person, place, and time. She appears well-developed and well-nourished. No distress.  HENT:  Head: Normocephalic and atraumatic.  Right Ear: External ear normal.  Left Ear: External ear normal.  Eyes: Conjunctivae and EOM are normal. Pupils are equal, round, and reactive to light.  Cardiovascular: Normal rate.  Pulmonary/Chest: Effort normal. No respiratory distress.  Neurological: She is alert and oriented to person, place, and time.  Skin: She is not diaphoretic.  Tender mobile rubbery nodule left anterior superior cervical.  No erythema.  No lymphadenoathy at neighboring areas.    Psychiatric: She has a normal mood and affect. Her behavior is normal.     Assessment and Plan: Leah Barrera is a 62 y.o. female who is here today for cc of  Chief Complaint  Patient presents with  . Lymphadenopathy    left sidex 2 wks/ pt has been icing the area  starting clindamycin.  Proceed with ultrasound.  Lymphadenopathy of left cervical region - Plan: POCT rapid strep A, CBC with Differential/Platelet, clindamycin (CLEOCIN) 300 MG capsule, Sedimentation Rate, Epstein-Barr virus VCA antibody panel, US SOFT TISSUE NECK, CANCELED:  CBC  Neck mass - Plan: US SOFT TISSUE NECK  Trena PlattStephanie English, PA-C [facility] 1/14/20196:19 PM

## 2017-12-26 NOTE — Patient Instructions (Addendum)
Lymphadenopathy Lymphadenopathy refers to swollen or enlarged lymph glands, also called lymph nodes. Lymph glands are part of your body's defense (immune) system, which protects the body from infections, germs, and diseases. Lymph glands are found in many locations in your body, including the neck, underarm, and groin. Many things can cause lymph glands to become enlarged. When your immune system responds to germs, such as viruses or bacteria, infection-fighting cells and fluid build up. This causes the glands to grow in size. Usually, this is not something to worry about. The swelling and any soreness often go away without treatment. However, swollen lymph glands can also be caused by a number of diseases. Your health care provider may do various tests to help determine the cause. If the cause of your swollen lymph glands cannot be found, it is important to monitor your condition to make sure the swelling goes away. Follow these instructions at home: Watch your condition for any changes. The following actions may help to lessen any discomfort you are feeling:  Get plenty of rest.  Take medicines only as directed by your health care provider. Your health care provider may recommend over-the-counter medicines for pain.  Apply moist heat compresses to the site of swollen lymph nodes as directed by your health care provider. This can help reduce any pain.  Check your lymph nodes daily for any changes.  Keep all follow-up visits as directed by your health care provider. This is important.  Contact a health care provider if:  Your lymph nodes are still swollen after 2 weeks.  Your swelling increases or spreads to other areas.  Your lymph nodes are hard, seem fixed to the skin, or are growing rapidly.  Your skin over the lymph nodes is red and inflamed.  You have a fever.  You have chills.  You have fatigue.  You develop a sore throat.  You have abdominal pain.  You have weight  loss.  You have night sweats. Get help right away if:  You notice fluid leaking from the area of the enlarged lymph node.  You have severe pain in any area of your body.  You have chest pain.  You have shortness of breath. This information is not intended to replace advice given to you by your health care provider. Make sure you discuss any questions you have with your health care provider. Document Released: 09/07/2008 Document Revised: 05/06/2016 Document Reviewed: 07/04/2014 Elsevier Interactive Patient Education  2018 Elsevier Inc.     IF you received an x-ray today, you will receive an invoice from [facility]. Please contact [facility] at [phone] with questions or concerns regarding your invoice.   IF you received labwork today, you will receive an invoice from [facility]. Please contact [facility] at [phone] with questions or concerns regarding your invoice.   Our billing staff will not be able to assist you with questions regarding bills from these companies.  You will be contacted with the lab results as soon as they are available. The fastest way to get your results is to activate your My Chart account. Instructions are located on the last page of this paperwork. If you have not heard from us regarding the results in 2 weeks, please contact this office.     

## 2017-12-27 LAB — CBC WITH DIFFERENTIAL/PLATELET
Basophils Absolute: 0 10*3/uL (ref 0.0–0.2)
Basos: 0 %
EOS (ABSOLUTE): 0 10*3/uL (ref 0.0–0.4)
EOS: 0 %
HEMATOCRIT: 39.3 % (ref 34.0–46.6)
Hemoglobin: 12.9 g/dL (ref 11.1–15.9)
IMMATURE GRANS (ABS): 0 10*3/uL (ref 0.0–0.1)
IMMATURE GRANULOCYTES: 0 %
LYMPHS: 39 %
Lymphocytes Absolute: 1.1 10*3/uL (ref 0.7–3.1)
MCH: 26.7 pg (ref 26.6–33.0)
MCHC: 32.8 g/dL (ref 31.5–35.7)
MCV: 81 fL (ref 79–97)
MONOS ABS: 0.5 10*3/uL (ref 0.1–0.9)
Monocytes: 17 %
NEUTROS PCT: 44 %
Neutrophils Absolute: 1.2 10*3/uL — ABNORMAL LOW (ref 1.4–7.0)
Platelets: 165 10*3/uL (ref 150–379)
RBC: 4.83 x10E6/uL (ref 3.77–5.28)
RDW: 15.3 % (ref 12.3–15.4)
WBC: 2.9 10*3/uL — ABNORMAL LOW (ref 3.4–10.8)

## 2017-12-27 LAB — SEDIMENTATION RATE: Sed Rate: 47 mm/h — ABNORMAL HIGH (ref 0–40)

## 2017-12-27 LAB — EPSTEIN-BARR VIRUS VCA ANTIBODY PANEL
EBV Early Antigen Ab, IgG: 22.7 U/mL — ABNORMAL HIGH (ref 0.0–8.9)
EBV NA IgG: 273 U/mL — ABNORMAL HIGH (ref 0.0–17.9)
EBV VCA IgG: >600 U/mL — ABNORMAL HIGH (ref 0.0–17.9)
EBV VCA IgM: <36 U/mL (ref 0.0–35.9)

## 2017-12-29 ENCOUNTER — Ambulatory Visit: Payer: Self-pay | Admitting: *Deleted

## 2017-12-29 NOTE — Telephone Encounter (Signed)
Returned  Call to patient  She  Had   Called   Earlier  About   [facility]  Results  Results  Have  Not  Been  Released  Yet  .  Pt   Advised  To  Expect   A  Phone  Call  .  Pt  Also  Reports   Some  Bruising  To   r   anticubidal  Area   Where  They  Drew  Blood    3  Days  Ago    Pt  denys  Any  Numbness  Or  Tingling  .  No  Red  Streaks  On  Her  Arms   Pt  Is  Able  tyo  Make  A  Fist   The  Area  Is  About  The  Size   Of  A  Marble  .  Oval  Shaped  . Pt denies   Any  Shortness  Of  Breath  Appt  Made  tommorow   At  [facility]

## 2017-12-30 ENCOUNTER — Encounter: Payer: Self-pay | Admitting: Family Medicine

## 2017-12-30 ENCOUNTER — Other Ambulatory Visit: Payer: Self-pay

## 2017-12-30 ENCOUNTER — Ambulatory Visit (INDEPENDENT_AMBULATORY_CARE_PROVIDER_SITE_OTHER): Payer: BC Managed Care – PPO | Admitting: Family Medicine

## 2017-12-30 VITALS — BP 118/76 | HR 75 | Temp 97.5°F | Ht 67.0 in | Wt 173.4 lb

## 2017-12-30 DIAGNOSIS — R221 Localized swelling, mass and lump, neck: Secondary | ICD-10-CM

## 2017-12-30 NOTE — Patient Instructions (Addendum)
1. Continue with antibiotic, take yogurt or probiotics 2. Add naproxen (aleve) for pain 3. Trial of sour candy (lemon drops), papaya enzymes, moist heat 4. Please call our referral clerk if you have not heard about an appointment for your ultrasound by late next week    IF you received an x-ray today, you will receive an invoice from [facility]. Please contact [facility] at [phone] with questions or concerns regarding your invoice.   IF you received labwork today, you will receive an invoice from [facility]. Please contact [facility] at [phone] with questions or concerns regarding your invoice.   Our billing staff will not be able to assist you with questions regarding bills from these companies.  You will be contacted with the lab results as soon as they are available. The fastest way to get your results is to activate your My Chart account. Instructions are located on the last page of this paperwork. If you have not heard from us regarding the results in 2 weeks, please contact this office.

## 2017-12-30 NOTE — Progress Notes (Signed)
1/18/20194:54 PM  Leah OhmsMarsha S Barrera 05/25/1956, 62 y.o. female [id number]  Chief Complaint  Patient presents with  . Mass    under lft side of neck  . Medication Problem    having nausea from the medication Cleocin    HPI:   Patient is a 62 y.o. female who presents today as she is having some nausea with clindamycin which was started on Jan 14th for 2 weeks of left sided cervical lymphadenopathy given PNC allergy.   She has not been taking abx with food, she denies any vomiting, abd pain, changes in stool, diarrhea.   She denies any changes to mass.  An ultrasound has been ordered.   [facility] 12/30/2017 12/26/2017 08/22/2017  Decreased Interest 0 0 0  Down, Depressed, Hopeless 0 0 0  PHQ - 2 Score 0 0 0    Allergies  Allergen Reactions  . Penicillins Swelling and Rash    Prior to Admission medications   Medication Sig Start Date End Date Taking? Authorizing Provider  clindamycin (CLEOCIN) 300 MG capsule Take 1 capsule (300 mg total) by mouth 3 (three) times daily for 10 days. 12/26/17 01/05/18 Yes Trena PlattEnglish, Stephanie D, PA  acetaminophen (TYLENOL) 325 MG tablet Take 650 mg by mouth every 6 (six) hours as needed.    [provider]  predniSONE (DELTASONE) 20 MG tablet Take 3 PO QAM x2days, 2 PO QAM x2days, 1 PO QAM x3days Patient not taking: Reported on 12/26/2017 08/22/17   Garnetta BuddyEnglish, Stephanie D, PA    Past Medical History:  Diagnosis Date  . Keloid scar   . Vertigo 2014  . Vitiligo 2012    Past Surgical History:  Procedure Laterality Date  . ROTATOR CUFF REPAIR     Left    Social History   Tobacco Use  . Smoking status: Never Smoker  . Smokeless tobacco: Never Used  Substance Use Topics  . Alcohol use: No    Family History  Problem Relation Age of Onset  . Heart disease Mother   . Stroke Father     ROS Per hpi  OBJECTIVE:  Blood pressure 118/76, pulse 75, temperature (!) 97.5 F (36.4 C), temperature source Oral, height 5\' 7"   (1.702 m), weight 173 lb 6.4 oz (78.7 kg), SpO2 99 %.  Physical Exam  Constitutional: She is oriented to person, place, and time and well-developed, well-nourished, and in no distress.  HENT:  Head: Normocephalic and atraumatic.    Right Ear: Hearing, tympanic membrane, external ear and ear canal normal.  Left Ear: Hearing, tympanic membrane, external ear and ear canal normal.  Mouth/Throat: Oropharynx is clear and moist.  Eyes: EOM are normal. Pupils are equal, round, and reactive to light.  Neck: Neck supple.  Cardiovascular: Normal rate, regular rhythm and normal heart sounds. Exam reveals no gallop and no friction rub.  No murmur heard. Pulmonary/Chest: Effort normal and breath sounds normal. She has no wheezes. She has no rales.  Lymphadenopathy:    She has no cervical adenopathy.  Neurological: She is alert and oriented to person, place, and time. Gait normal.  Skin: Skin is warm and dry.     ASSESSMENT and PLAN  1. Submental mass Discussed taking abx with food, starting probiotics, importance of completing course of abx, keep us appointment. rtc precautions reviewed.   Return if symptoms worsen or fail to improve.    Myles LippsIrma M Santiago, MD [facility] [address] [place], [place] [address] Ph.  [phone] Fax  [phone]   

## 2018-01-08 ENCOUNTER — Telehealth: Payer: Self-pay | Admitting: Physician Assistant

## 2018-01-08 NOTE — Telephone Encounter (Signed)
I am awaiting an ultrasound.  Any info on this?

## 2018-01-09 ENCOUNTER — Telehealth: Payer: Self-pay | Admitting: Physician Assistant

## 2018-01-09 NOTE — Telephone Encounter (Signed)
Patient notified of her lab results. She reports she is much better- still a little swollen- but not sore anymore. She will await the call about her US.  ( lab not in basket)

## 2018-01-09 NOTE — Telephone Encounter (Signed)
[facility] They tried contacting the pt and left her a message. I spoke with the pt and advised her of their phone number so she can call them back to schedule.

## 2018-01-11 ENCOUNTER — Ambulatory Visit
Admission: RE | Admit: 2018-01-11 | Discharge: 2018-01-11 | Disposition: A | Discharge status: Home / Self Care | Payer: BC Managed Care – PPO | Source: Ambulatory Visit | Attending: Physician Assistant | Admitting: Physician Assistant

## 2018-01-11 ENCOUNTER — Encounter: Payer: Self-pay | Admitting: Family Medicine

## 2018-01-11 DIAGNOSIS — R221 Localized swelling, mass and lump, neck: Secondary | ICD-10-CM

## 2018-01-11 DIAGNOSIS — R590 Localized enlarged lymph nodes: Secondary | ICD-10-CM

## 2018-01-17 ENCOUNTER — Telehealth: Payer: Self-pay | Admitting: Physician Assistant

## 2018-01-17 DIAGNOSIS — K118 Other diseases of salivary glands: Secondary | ICD-10-CM

## 2018-01-17 NOTE — Telephone Encounter (Signed)
Spoke with patient of imaging results. Discussed that I am referring to her an ear nose and throat, but await my contact.  Contacted ear nose and throat.  Discussed with Mick Selllakeisha, to have a provider contact me.  They are willing to see patient tomorrow, but if the preference is to obtain labs, and a specific ct with contrast, then I will have this obtain first.

## 2018-01-26 NOTE — Telephone Encounter (Signed)
[facility]- urgent appointment scheduled for tomorrow at 10:30 per Trena PlattStephanie English. Imaging and recent appointment notes faxed. Patient has appointment with Dr. Lazarus SalinesWolicki tomorrow.   Phone call to patient. Informed of appointment. She is agreeable, verbalizes understanding.   Provider, Lorain ChildesFYI.

## 2018-02-03 ENCOUNTER — Other Ambulatory Visit: Payer: Self-pay | Admitting: Otolaryngology

## 2018-02-03 DIAGNOSIS — K115 Sialolithiasis: Secondary | ICD-10-CM

## 2018-02-15 ENCOUNTER — Ambulatory Visit
Admission: RE | Admit: 2018-02-15 | Discharge: 2018-02-15 | Disposition: A | Discharge status: Home / Self Care | Payer: BC Managed Care – PPO | Source: Ambulatory Visit | Attending: Otolaryngology | Admitting: Otolaryngology

## 2018-02-15 DIAGNOSIS — K115 Sialolithiasis: Secondary | ICD-10-CM

## 2018-02-15 MED ORDER — IOPAMIDOL (ISOVUE-300) INJECTION 61%
75.0000 mL | Freq: Once | INTRAVENOUS | Status: AC | PRN
  Administered 2018-02-15: 75 mL via INTRAVENOUS

## 2018-03-14 ENCOUNTER — Encounter: Payer: Self-pay | Admitting: Physician Assistant

## 2019-09-18 IMAGING — CT CT NECK W/ CM
4 of 7 series · 13 of 33 positions shown, 15 images · IV contrast (iopamidol)
Comparison: Ultrasound 01/11/2018

CLINICAL DATA: Intermittent pain and swelling of the left
submandibular gland. Assess for stone disease.

Creatinine was obtained on site at [HOSPITAL] at [REDACTED].
Results: Creatinine 0.7 mg/dL.
EXAM:
CT NECK WITH CONTRAST
TECHNIQUE: Multidetector CT imaging of the neck was performed using the
standard protocol following the bolus administration of intravenous
contrast.
CONTRAST:  75mL [id number] IOPAMIDOL (YJY9NS-VKK) INJECTION 61%

[Series 4: neck 2.00 br36 s3 (person_name) · axial · 0.38mm/px · z∈[-884,-800]mm · 2 of 127 slices shown]
[im 43/127  bone]
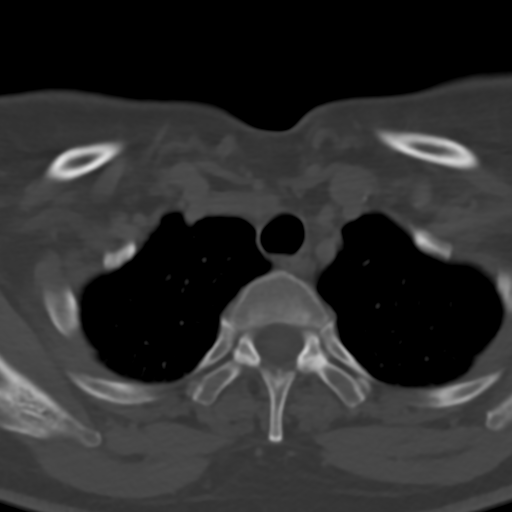
[im 85/127  bone]
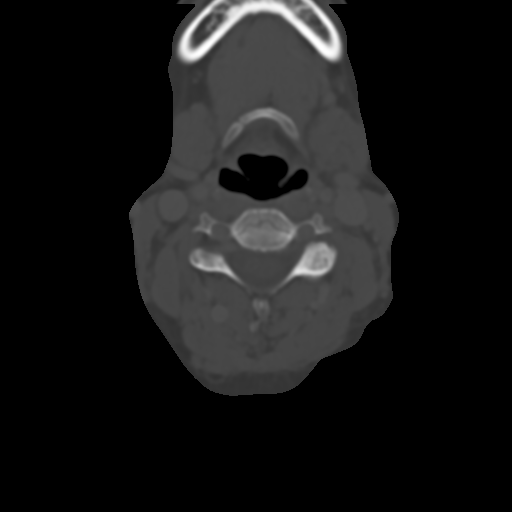

[Series 9: neck 2.00 br36 s3 cor coronal (person_name) · coronal · 0.37mm/px · 3 of 94 slices shown]
[im 20/94  bone]
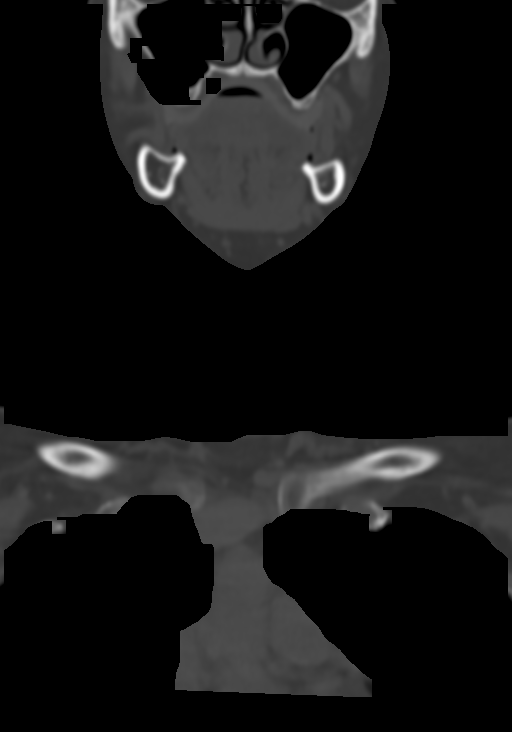
[im 38/94  bone]
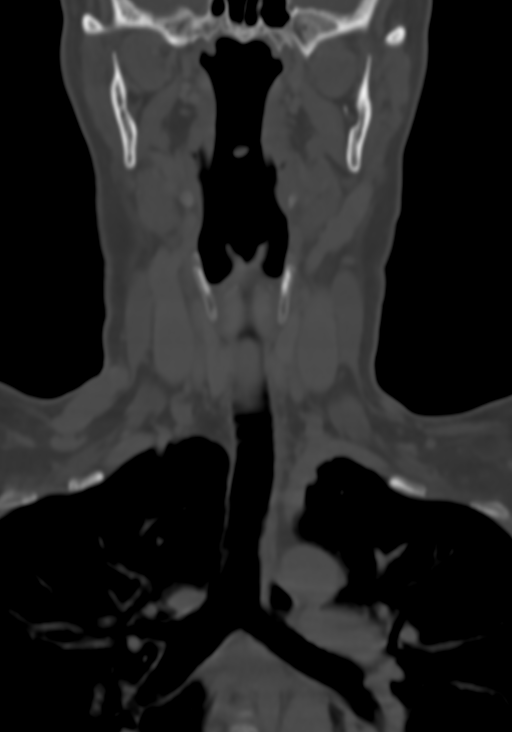
[im 56/94  bone]
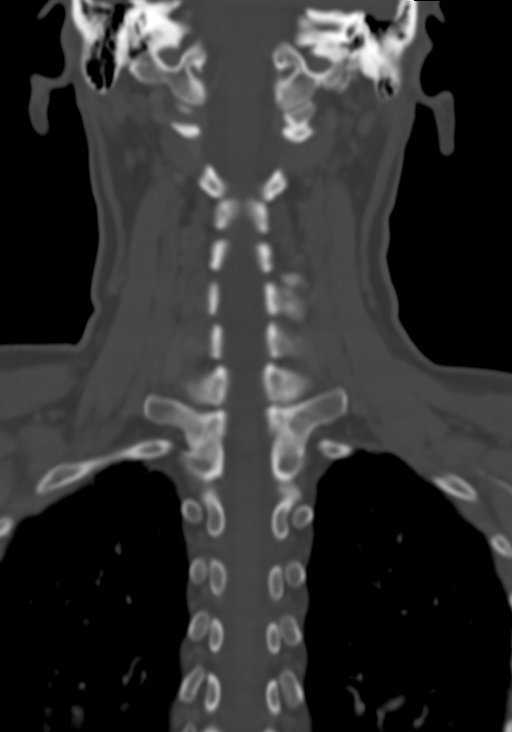

[Series 11: neck 2.00 br36 s3 sag sagittal (person_name) · sagittal · 0.37mm/px · 5 of 94 slices shown, 6 images]
[im 32/94  bone]
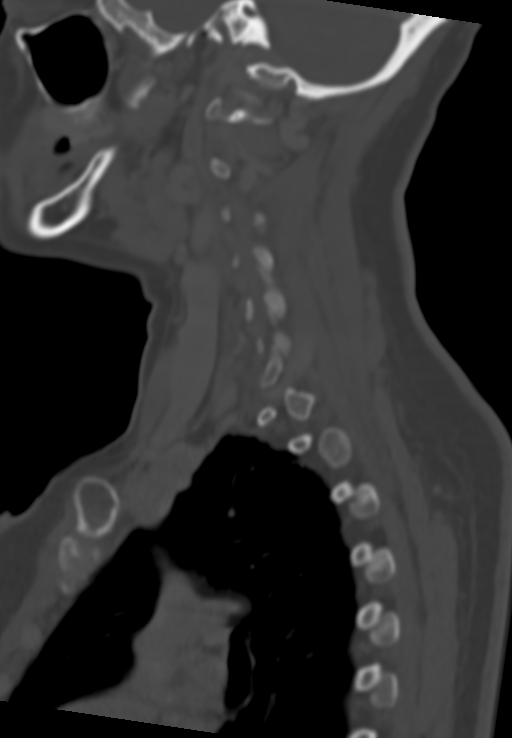
[im 39/94  bone]
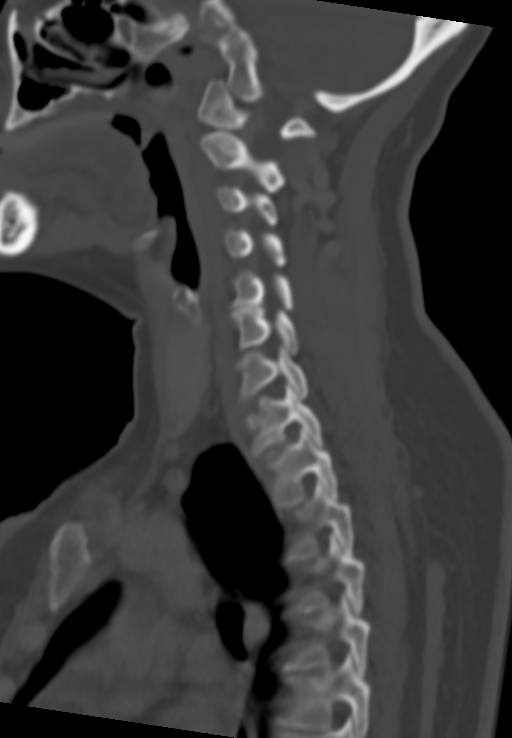
[im 47/94  soft-tissue]
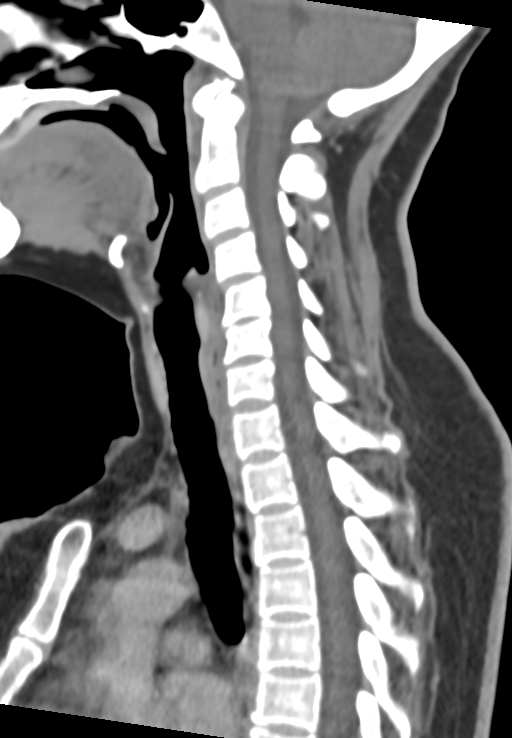
[im 47/94  bone]
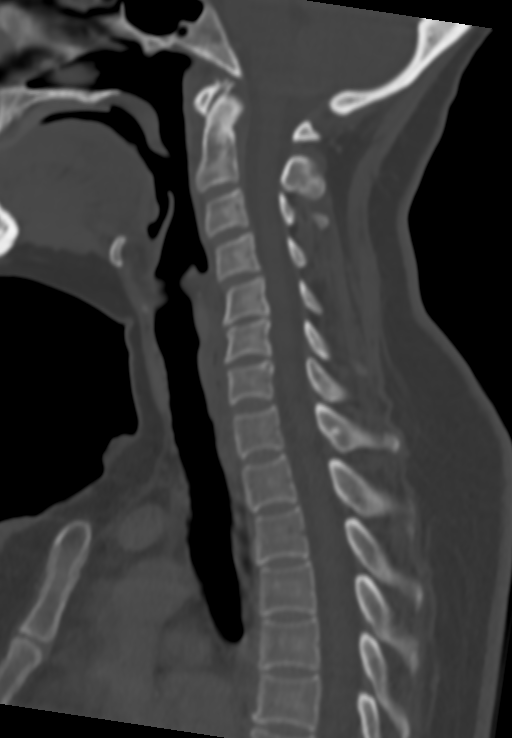
[im 55/94  bone]
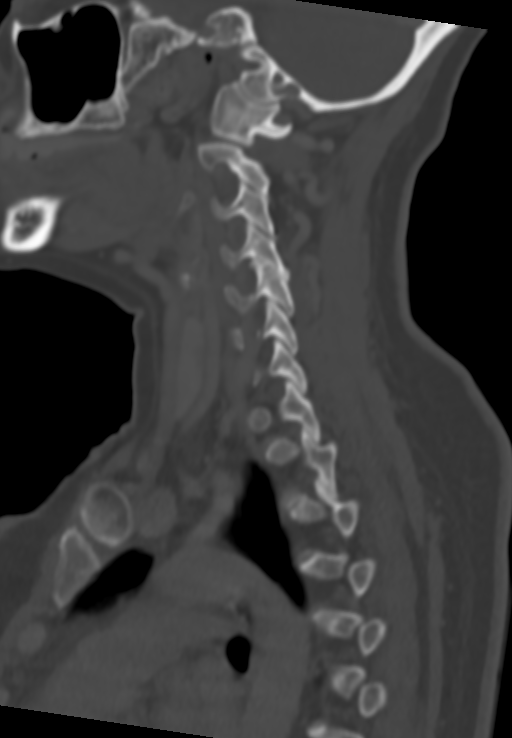
[im 63/94  bone]
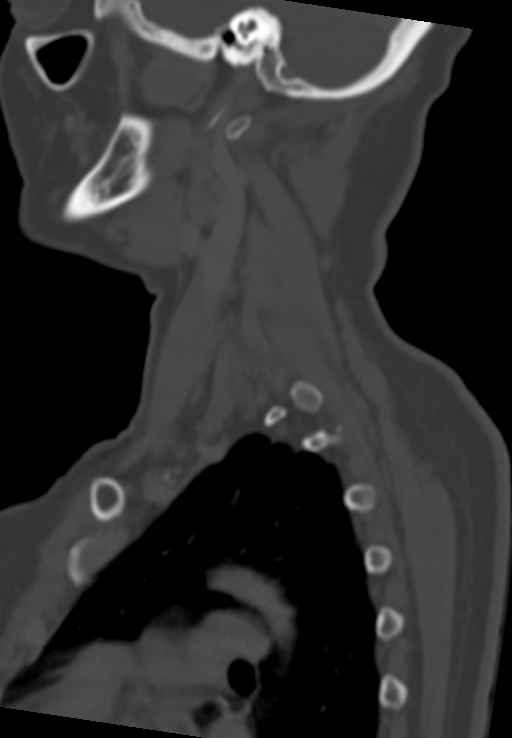

[Series 13: neck 2.00 br40 s3 ax oropharynx (person_name) · axial · 0.37mm/px · z∈[-996,-732]mm · 3 of 137 slices shown, 4 images]
[im 1/137  soft-tissue]
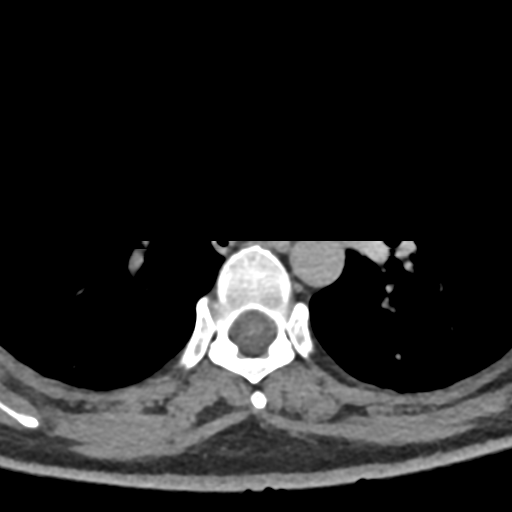
[im 1/137  bone]
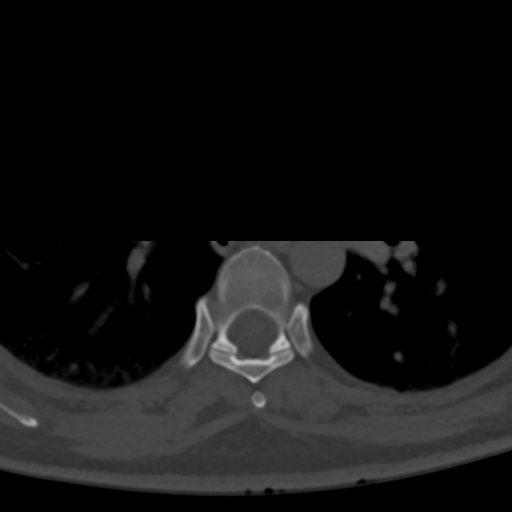
[im 69/137  bone]
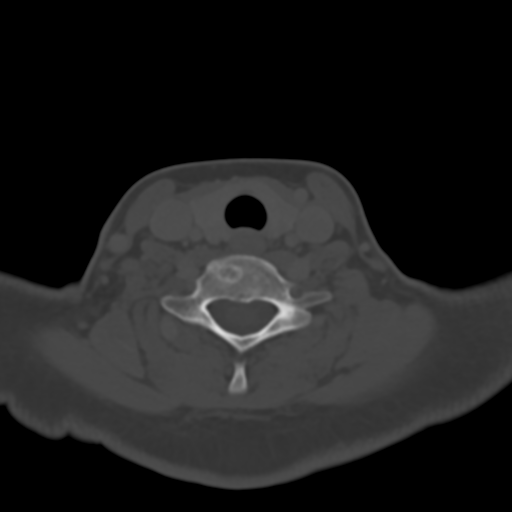
[im 137/137  bone]
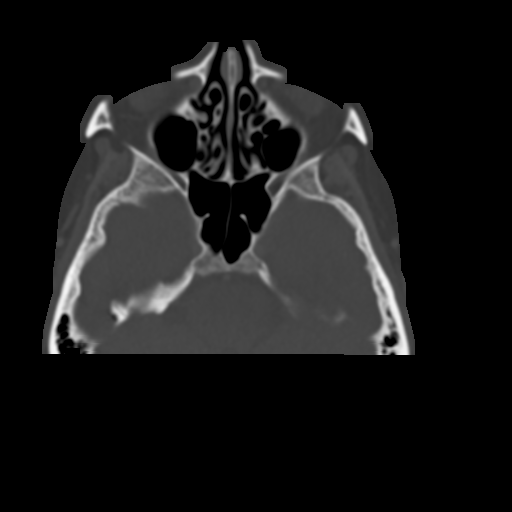

[13 of 33 positions shown; findings below may reference images not displayed]

FINDINGS: Pharynx and larynx: No mucosal or submucosal lesion.

Salivary glands: Parotid glands are normal. Right submandibular
gland is normal. Left submandibular gland is slightly enlarged, with
prominence of the ductal tree. There is a 1.4 cm stone at the
junction of the gland and the main submandibular duct. No stone more
distally within the duct.

Thyroid: Normal

Lymph nodes: No enlarged or low-density nodes on either side of the
neck.

Vascular: No abnormal vascular findings.

Limited intracranial: Normal

Visualized orbits: Normal

Mastoids and visualized paranasal sinuses: Clear

Skeleton: Minimal spondylosis C5-6.

Upper chest: Normal

Other: None
IMPRESSION: 1.4 cm stone at the junction of the left submandibular gland and the
submandibular duct. Mild fullness of the glandular ductal system.

## 2019-10-20 IMAGING — US US SOFT TISSUE HEAD/NECK
1 series · 13 of 25 positions shown · non-contrast
Comparison: None.

CLINICAL DATA: Left submandibular lymphadenopathy.

EXAM:
ULTRASOUND OF HEAD/NECK SOFT TISSUES
TECHNIQUE: Ultrasound examination of the head and neck soft tissues was
performed in the area of clinical concern.

[Series 1: us soft tissue head/neck · 0.07mm/px · 51 acquisitions, 13 frames shown]
[im 1/51]
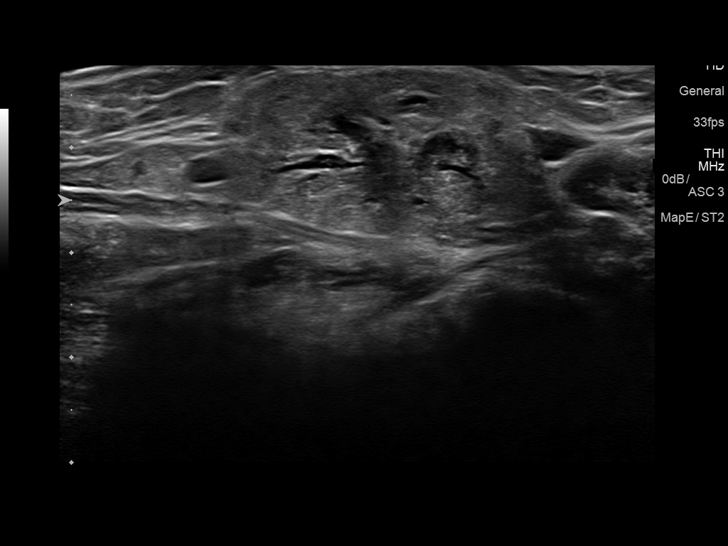
[im 5/51]
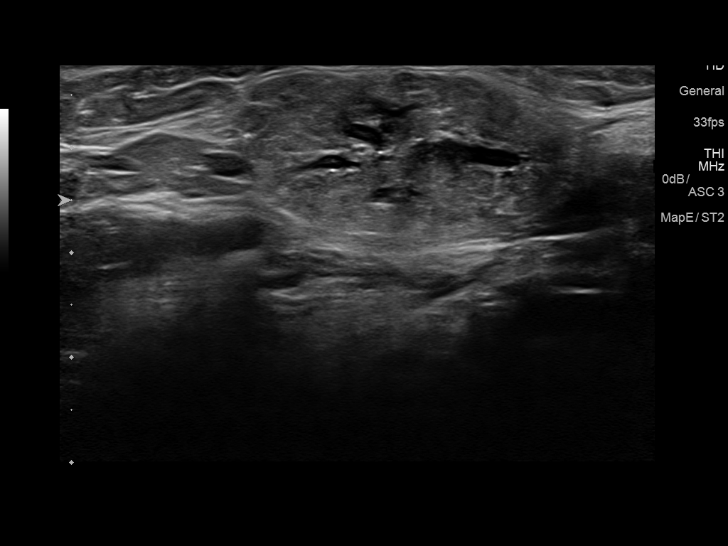
[im 9/51]
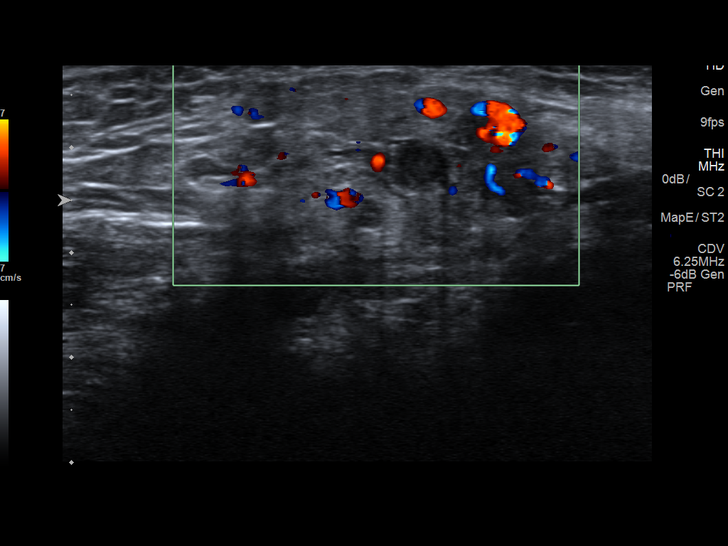
[im 13/51]
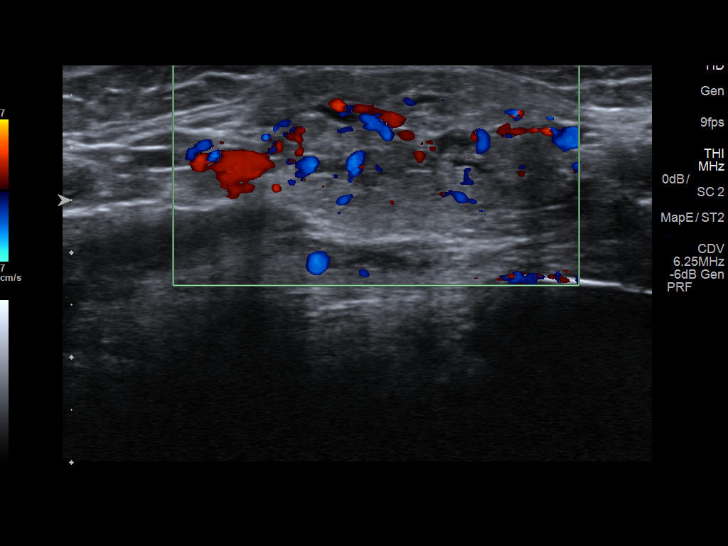
[im 17/51]
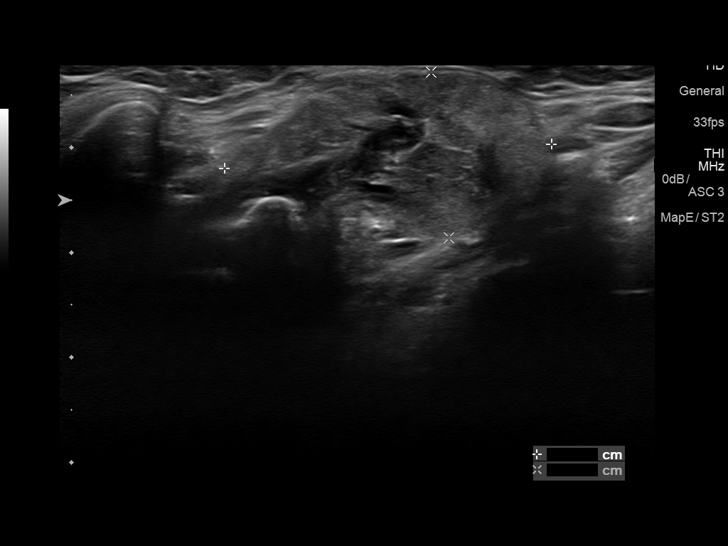
[im 21/51]
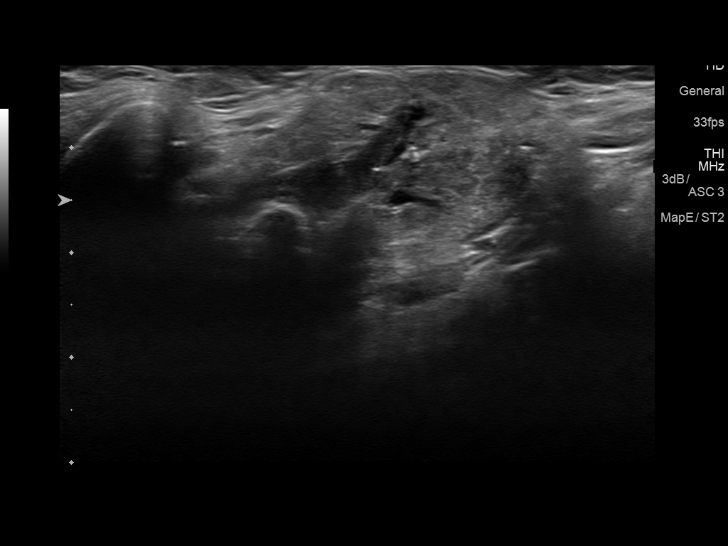
[im 26/51]
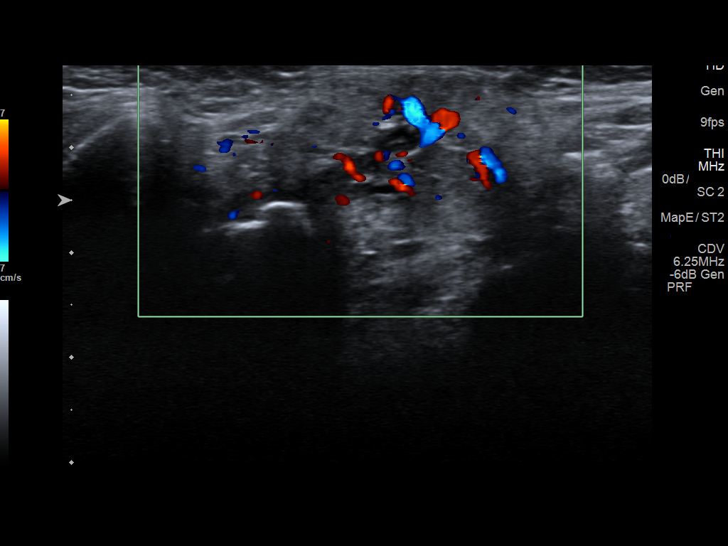
[im 30/51]
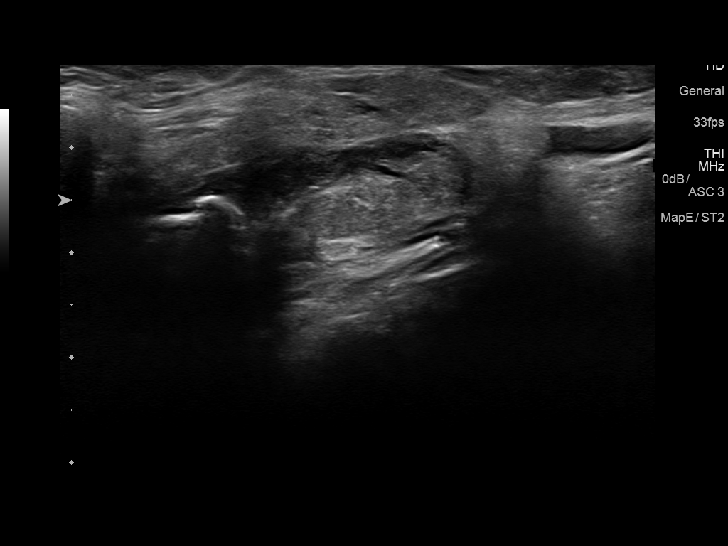
[im 34/51]
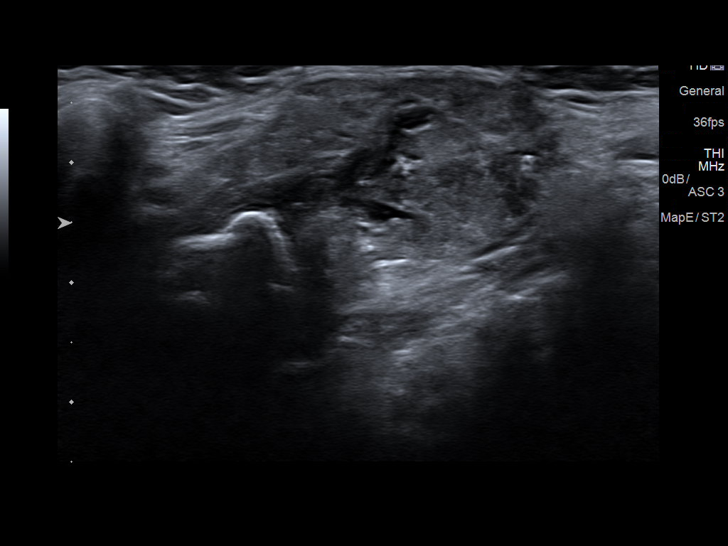
[im 38/51]
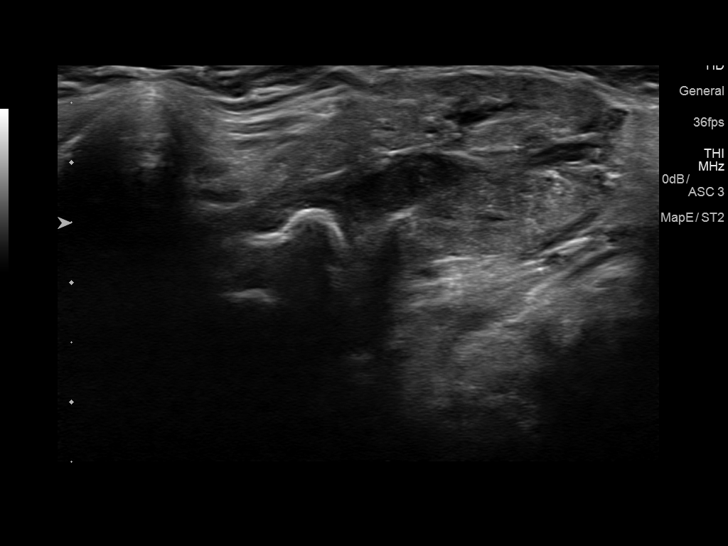
[im 42/51]
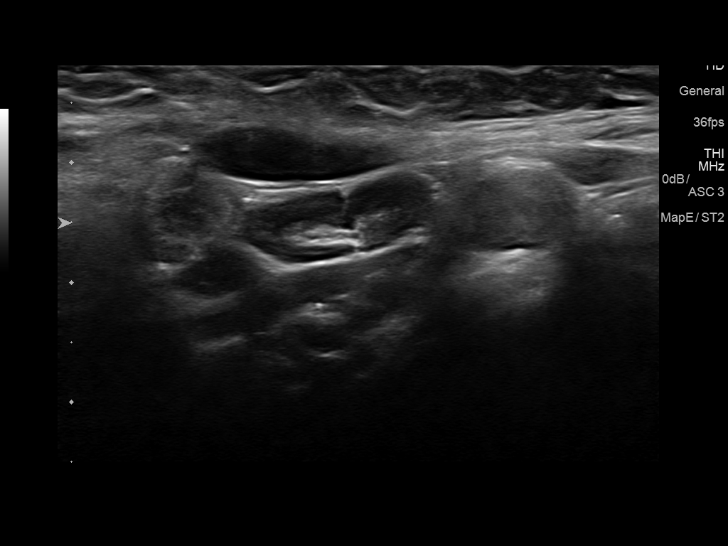
[im 46/51]
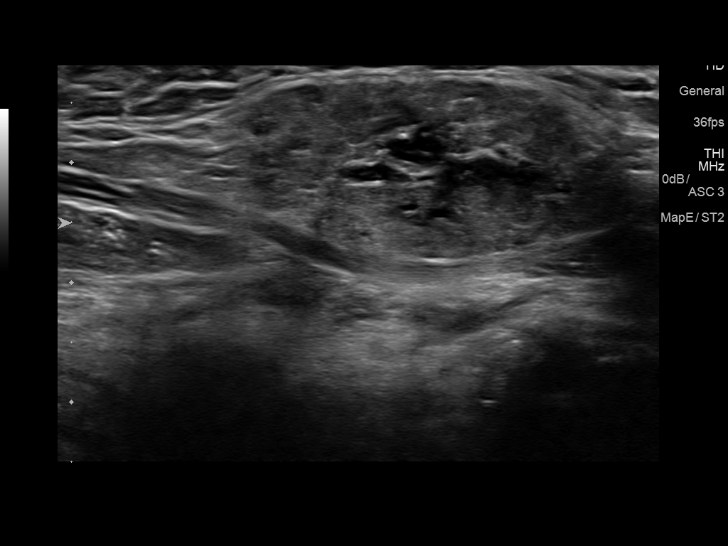
[im 51/51]
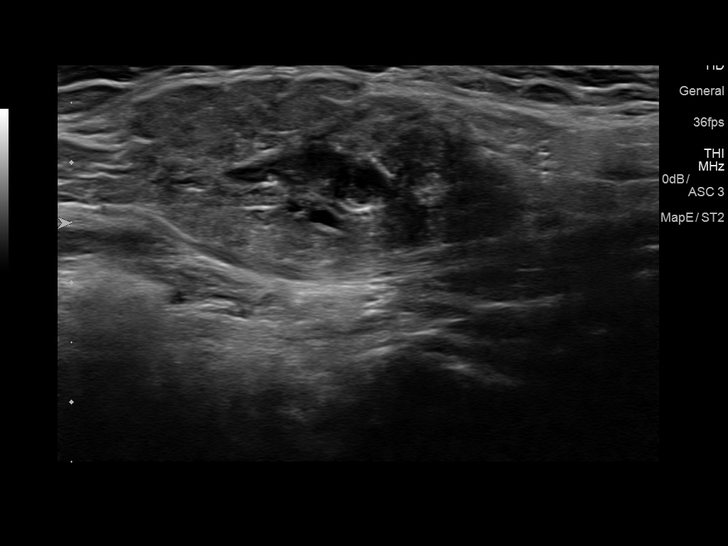

[13 of 25 positions shown; findings below may reference images not displayed]

FINDINGS: Patient's palpable area of concern appears to correlate with
masslike enlargement involving the caudal aspect of the
submandibular gland. The ill-defined masslike area involving the
caudal aspect of the right submandibular gland measures
approximately 3.1 x 1.6 x 3.0 cm with associated apparent dilatation
of the submandibular duct.

Note is made of 2 adjacent benign appearing non pathologically
enlarged lymph nodes with index lymph node measuring approximately
0.6 cm in greatest short axis diameter.

Note is made of similar masslike enlargement involving the caudal
aspect of the contralateral left submandibular gland, also was
apparent ductal dilatation (images 54 and 55).
IMPRESSION: 1. Potential masslike enlargement involving the caudal aspects of
the bilateral submandibular glands with potential bilateral ductal
dilatation, right greater than left. Given apparent symmetry,
inflammatory/infectious etiologies are of potential differential
considerations, though malignancy not excluded on the basis of this
examination. If not previously performed, this patient could benefit
from formal ENT consult. Otherwise, further evaluation could be
performed with contrast-enhanced neck CT as indicated.
2. No definitive regional cervical lymphadenopathy.
